# Patient Record
Sex: Male | Born: 1995 | Hispanic: No | Marital: Single | Smoking: Former smoker
Health system: Southern US, Community
[De-identification: ages and names within clinical notes are randomized; demographics above are authoritative.]

## PROBLEM LIST (undated history)

## (undated) DIAGNOSIS — G4700 Insomnia, unspecified: Secondary | ICD-10-CM

## (undated) DIAGNOSIS — R569 Unspecified convulsions: Secondary | ICD-10-CM

## (undated) DIAGNOSIS — F419 Anxiety disorder, unspecified: Secondary | ICD-10-CM

## (undated) HISTORY — PX: LABRAL REPAIR: SHX5172

---

## 2017-06-10 ENCOUNTER — Emergency Department (HOSPITAL_COMMUNITY): Payer: Non-veteran care

## 2017-06-10 ENCOUNTER — Encounter (HOSPITAL_COMMUNITY): Payer: Self-pay

## 2017-06-10 DIAGNOSIS — F419 Anxiety disorder, unspecified: Secondary | ICD-10-CM | POA: Insufficient documentation

## 2017-06-10 DIAGNOSIS — J209 Acute bronchitis, unspecified: Secondary | ICD-10-CM | POA: Insufficient documentation

## 2017-06-10 DIAGNOSIS — Z79899 Other long term (current) drug therapy: Secondary | ICD-10-CM | POA: Insufficient documentation

## 2017-06-10 DIAGNOSIS — Z87891 Personal history of nicotine dependence: Secondary | ICD-10-CM | POA: Insufficient documentation

## 2017-06-10 DIAGNOSIS — R05 Cough: Secondary | ICD-10-CM | POA: Diagnosis present

## 2017-06-10 NOTE — ED Triage Notes (Signed)
Pt complains of being short of breath for about an hour He states he has anxiety but it usually goes away He also says that his right arm is becoming numb at his wrist

## 2017-06-11 ENCOUNTER — Other Ambulatory Visit: Payer: Self-pay

## 2017-06-11 ENCOUNTER — Emergency Department (HOSPITAL_COMMUNITY)
Admission: EM | Admit: 2017-06-11 | Discharge: 2017-06-11 | Disposition: A | Discharge status: Home / Self Care | Payer: Non-veteran care | Attending: Emergency Medicine | Admitting: Emergency Medicine

## 2017-06-11 DIAGNOSIS — J40 Bronchitis, not specified as acute or chronic: Secondary | ICD-10-CM

## 2017-06-11 DIAGNOSIS — F419 Anxiety disorder, unspecified: Secondary | ICD-10-CM

## 2017-06-11 MED ORDER — AMOXICILLIN 500 MG PO CAPS: 500.0000 mg | ORAL_CAPSULE | Freq: Three times a day (TID) | ORAL | 0 refills | Status: DC

## 2017-06-11 MED ORDER — LORAZEPAM 1 MG PO TABS
1.0000 mg | ORAL_TABLET | Freq: Once | ORAL | Status: AC
  Administered 2017-06-11: 1 mg via ORAL
  Filled 2017-06-11: qty 1

## 2017-06-11 NOTE — ED Provider Notes (Signed)
[facility]-EMERGENCY DEPT Provider Note   CSN: [id number] Arrival date & time: 06/10/17  2135  Time seen 04:10 AM   History   Chief Complaint Chief Complaint  Patient presents with  . Shortness of Breath    HPI Dylan Castillo is a 21 y.o. male.  HPI patient states he has had a cough with green sputum production for a couple weeks.  He denies any fever.  He has been taking Mucinex over-the-counter.  He states about 10 PM yesterday evening his chest started feeling tight and he felt dizzy and his right hand got numb.  He denies any wheezing.  He states he was watching TV at the time.  He states he has had this before with anxiety.  He states he is having a lot of panic attacks.  He was seen by a doctor about 6 months ago and was given hydroxyzine which did not work.  This was when he was living in [place].  He states he is never seen a psychiatrist or had an evaluation for anxiety.  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  He denies having PTSD.  He denies depression, suicidal ideation or homicidal ideation.  PCP none  History reviewed. No pertinent past medical history.  There are no active problems to display for this patient.   History reviewed. No pertinent surgical history.     Home Medications    Prior to Admission medications   Medication Sig Start Date End Date Taking? Authorizing Provider  dextromethorphan-guaiFENesin (MUCINEX DM) 30-600 MG 12hr tablet Take 1 tablet by mouth 2 (two) times daily as needed for cough (congestion).   Yes [provider]  guaifenesin (ROBITUSSIN) 100 MG/5ML syrup Take 200 mg by mouth 3 (three) times daily as needed for cough or congestion.   Yes [provider]  amoxicillin (AMOXIL) 500 MG capsule Take 1 capsule (500 mg total) by mouth 3 (three) times daily. 06/11/17   Devoria Albe, MD    Family History History reviewed. No  pertinent family history.  Social History Social History  Substance Use Topics  . Smoking status: Former Games developer  . Smokeless tobacco: Never Used  . Alcohol use No  ex [employer] On disability for labral tear Plans on going to school to study medicine Does drink but not recently Just moved to [facility]    Allergies   Patient has no known allergies.   Review of Systems Review of Systems  All other systems reviewed and are negative.    Physical Exam Updated Vital Signs BP 131/80 (BP Location: Right Arm)   Pulse 96   Temp 97.7 F (36.5 C) (Oral)   Resp (!) 21   SpO2 100%   Physical Exam  Constitutional: He is oriented to person, place, and time. He appears well-developed and well-nourished.  Non-toxic appearance. He does not appear ill. No distress.  HENT:  Head: Normocephalic and atraumatic.  Right Ear: External ear normal.  Left Ear: External ear normal.  Nose: Nose normal. No mucosal edema or rhinorrhea.  Mouth/Throat: Oropharynx is clear and moist and mucous membranes are normal. No dental abscesses or uvula swelling.  Eyes: Pupils are equal, round, and reactive to light. Conjunctivae and EOM are normal.  Neck: Normal range of motion and full passive range of motion without pain. Neck supple.  Cardiovascular: Normal rate, regular rhythm and normal heart sounds.  Exam reveals no  gallop and no friction rub.   No murmur heard. Pulmonary/Chest: Effort normal and breath sounds normal. No respiratory distress. He has no wheezes. He has no rhonchi. He has no rales. He exhibits no tenderness and no crepitus.  Abdominal: Soft. Normal appearance and bowel sounds are normal. He exhibits no distension. There is no tenderness. There is no rebound and no guarding.  Musculoskeletal: Normal range of motion. He exhibits no edema or tenderness.  Moves all extremities well.   Neurological: He is alert and oriented to person, place, and time. He has normal strength. No cranial nerve deficit.   Skin: Skin is warm, dry and intact. No rash noted. No erythema. No pallor.  Psychiatric: His speech is normal and behavior is normal. His mood appears anxious.  Taping his leg while talking to me  Nursing note and vitals reviewed.    ED Treatments / Results  Labs (all labs ordered are listed, but only abnormal results are displayed) Labs Reviewed - No data to display  EKG  EKG Interpretation  Date/Time:  Thursday June 11 2017 03:17:34 EDT Ventricular Rate:  87 PR Interval:    QRS Duration: 96 QT Interval:  377 QTC Calculation: 454 R Axis:   93 Text Interpretation:  Sinus rhythm Borderline right axis deviation Otherwise within normal limits No old tracing to compare Confirmed by Devoria AlbeKnapp, Shiane Wenberg ([id number]) on 06/11/2017 4:39:51 AM       Radiology Dg Chest 2 View  Result Date: 06/10/2017 CLINICAL DATA:  Dyspnea and chest tightness.  Ex-smoker. EXAM: CHEST  2 VIEW COMPARISON:  None. FINDINGS: The heart size and mediastinal contours are within normal limits. Both lungs are clear. The visualized skeletal structures are unremarkable. IMPRESSION: No active cardiopulmonary disease. Electronically Signed   By: Tollie Ethavid  Kwon M.D.   On: 06/10/2017 22:58    Procedures Procedures (including critical care time)  Medications Ordered in ED Medications  LORazepam (ATIVAN) tablet 1 mg (not administered)     Initial Impression / Assessment and Plan / ED Course  I have reviewed the triage vital signs and the nursing notes.  Pertinent labs & imaging results that were available during my care of the patient were reviewed by me and considered in my medical decision making (see chart for details).    Patient was given one Ativan in the ED.  He was started on amoxicillin for his bronchitis.  He was given the resource guide to try to get a local psychiatrist to evaluate and manage his anxiety or [facility].   Final Clinical Impressions(s) / ED Diagnoses    Final diagnoses:  Anxiety  Bronchitis    New Prescriptions New Prescriptions   AMOXICILLIN (AMOXIL) 500 MG CAPSULE    Take 1 capsule (500 mg total) by mouth 3 (three) times daily.    Plan discharge  Devoria AlbeIva Harshan Kearley, MD, Concha PyoFACEP    Duong Haydel, MD 06/11/17 [phone]

## 2017-06-11 NOTE — Discharge Instructions (Signed)
Take the antibiotic until gone. Continue the mucinex as needed for your cough. Recheck if you get a high fever or seem worse. You need to get an evaluation by a mental health worker for your anxiety, they will need to decide what medication you should be on and follow you to make sure you are doing better. You can do [facility] a [facility] or a local psychiatrist. Return to the ED if you feel like you are going to harm yourself or someone else.

## 2017-06-11 NOTE — ED Notes (Signed)
Bed: [facility]PT2 Expected date:  Expected time:  Means of arrival:  Comments: 

## 2018-03-10 ENCOUNTER — Encounter (HOSPITAL_COMMUNITY): Payer: Self-pay | Admitting: Emergency Medicine

## 2018-03-10 ENCOUNTER — Emergency Department (HOSPITAL_COMMUNITY)
Admission: EM | Admit: 2018-03-10 | Discharge: 2018-03-11 | Disposition: A | Discharge status: Home / Self Care | Payer: Non-veteran care | Attending: Emergency Medicine | Admitting: Emergency Medicine

## 2018-03-10 ENCOUNTER — Emergency Department (HOSPITAL_COMMUNITY): Payer: Non-veteran care

## 2018-03-10 DIAGNOSIS — R569 Unspecified convulsions: Secondary | ICD-10-CM | POA: Diagnosis not present

## 2018-03-10 DIAGNOSIS — Z87891 Personal history of nicotine dependence: Secondary | ICD-10-CM | POA: Insufficient documentation

## 2018-03-10 DIAGNOSIS — M25511 Pain in right shoulder: Secondary | ICD-10-CM | POA: Insufficient documentation

## 2018-03-10 LAB — CBG MONITORING, ED: GLUCOSE-CAPILLARY: 102 mg/dL — AB (ref 70–99)

## 2018-03-10 MED ORDER — LEVETIRACETAM 500 MG PO TABS: 500.0000 mg | ORAL_TABLET | Freq: Two times a day (BID) | ORAL | 1 refills | Status: DC

## 2018-03-10 MED ORDER — OXYCODONE-ACETAMINOPHEN 5-325 MG PO TABS
2.0000 | ORAL_TABLET | Freq: Once | ORAL | Status: AC
  Administered 2018-03-10: 2 via ORAL
  Filled 2018-03-10: qty 2

## 2018-03-10 MED ORDER — IBUPROFEN 800 MG PO TABS: 800.0000 mg | ORAL_TABLET | Freq: Three times a day (TID) | ORAL | 0 refills | Status: AC

## 2018-03-10 MED ORDER — LEVETIRACETAM 500 MG PO TABS
1000.0000 mg | ORAL_TABLET | Freq: Once | ORAL | Status: AC
  Administered 2018-03-10: 1000 mg via ORAL
  Filled 2018-03-10: qty 2

## 2018-03-10 MED ORDER — KETOROLAC TROMETHAMINE 60 MG/2ML IM SOLN
60.0000 mg | Freq: Once | INTRAMUSCULAR | Status: AC
  Administered 2018-03-10: 60 mg via INTRAMUSCULAR
  Filled 2018-03-10: qty 2

## 2018-03-10 NOTE — ED Triage Notes (Signed)
  Patient was having an argument with significant other.  Started having anxiety attack and felt dizzy.  Girlfriend stated he had a seizure that lasted 2 mins and he was stiff and shaking.  Patient fell from standing position on R arm.  Patient was post ictal on EMS arrival.  Patient states no ETOH or drugs.  Patient is A&O x 4.  Pain 6/10

## 2018-03-11 ENCOUNTER — Emergency Department (HOSPITAL_BASED_OUTPATIENT_CLINIC_OR_DEPARTMENT_OTHER): Payer: No Typology Code available for payment source

## 2018-03-11 ENCOUNTER — Other Ambulatory Visit: Payer: Self-pay

## 2018-03-11 ENCOUNTER — Encounter (HOSPITAL_BASED_OUTPATIENT_CLINIC_OR_DEPARTMENT_OTHER): Payer: Self-pay | Admitting: Emergency Medicine

## 2018-03-11 ENCOUNTER — Emergency Department (HOSPITAL_BASED_OUTPATIENT_CLINIC_OR_DEPARTMENT_OTHER)
Admission: EM | Admit: 2018-03-11 | Discharge: 2018-03-11 | Disposition: A | Discharge status: Home / Self Care | Payer: No Typology Code available for payment source | Attending: Emergency Medicine | Admitting: Emergency Medicine

## 2018-03-11 DIAGNOSIS — Y999 Unspecified external cause status: Secondary | ICD-10-CM | POA: Diagnosis not present

## 2018-03-11 DIAGNOSIS — S4991XA Unspecified injury of right shoulder and upper arm, initial encounter: Secondary | ICD-10-CM | POA: Diagnosis present

## 2018-03-11 DIAGNOSIS — X58XXXA Exposure to other specified factors, initial encounter: Secondary | ICD-10-CM | POA: Diagnosis not present

## 2018-03-11 DIAGNOSIS — S43014A Anterior dislocation of right humerus, initial encounter: Secondary | ICD-10-CM | POA: Insufficient documentation

## 2018-03-11 DIAGNOSIS — S43004A Unspecified dislocation of right shoulder joint, initial encounter: Secondary | ICD-10-CM

## 2018-03-11 DIAGNOSIS — Y939 Activity, unspecified: Secondary | ICD-10-CM | POA: Diagnosis not present

## 2018-03-11 DIAGNOSIS — Y929 Unspecified place or not applicable: Secondary | ICD-10-CM | POA: Diagnosis not present

## 2018-03-11 HISTORY — DX: Anxiety disorder, unspecified: F41.9

## 2018-03-11 HISTORY — DX: Insomnia, unspecified: G47.00

## 2018-03-11 HISTORY — DX: Unspecified convulsions: R56.9

## 2018-03-11 MED ORDER — PROPOFOL BOLUS VIA INFUSION
1.0000 mg/kg | Freq: Once | INTRAVENOUS | Status: DC
  Filled 2018-03-11: qty 74

## 2018-03-11 MED ORDER — PROPOFOL 10 MG/ML IV BOLUS
INTRAVENOUS | Status: AC | PRN
  Administered 2018-03-11: 35 mg via INTRAVENOUS
  Administered 2018-03-11 (×2): 70 mg via INTRAVENOUS
  Administered 2018-03-11: 35 mg via INTRAVENOUS

## 2018-03-11 MED ORDER — PROPOFOL 10 MG/ML IV BOLUS
INTRAVENOUS | Status: AC
  Filled 2018-03-11: qty 20

## 2018-03-11 MED ORDER — FENTANYL CITRATE (PF) 100 MCG/2ML IJ SOLN
200.0000 ug | Freq: Once | INTRAMUSCULAR | Status: AC
  Administered 2018-03-11: 200 ug via INTRAVENOUS
  Filled 2018-03-11: qty 4

## 2018-03-11 MED ORDER — HYDROCODONE-ACETAMINOPHEN 5-325 MG PO TABS: 1.0000 | ORAL_TABLET | ORAL | 0 refills | Status: AC | PRN

## 2018-03-11 NOTE — ED Notes (Signed)
Pt A&O x 4, walking with steady gait upon d/c. Girlfriend to drive pt home.

## 2018-03-11 NOTE — ED Notes (Signed)
Dr. Madilyn Hookees at bedside. Pt given po fluids.

## 2018-03-11 NOTE — ED Provider Notes (Signed)
[facility] EMERGENCY DEPARTMENT Provider Note   CSN: [id number] Arrival date & time: 03/11/18  0543     History   Chief Complaint Chief Complaint  Patient presents with  . Shoulder Pain    HPI Dylan Castillo is a 22 y.o. male.  Patient presents to the emergency department with complaints of severe right shoulder pain.  Patient thinks that he dislocated his shoulder.  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  Patient reports that he rates his arm up this morning and felt a pop, has severe pain and decreased mobility of the shoulder ever since.     Past Medical History:  Diagnosis Date  . Anxiety   . Insomnia   . Seizures (HCC)     There are no active problems to display for this patient.   Past Surgical History:  Procedure Laterality Date  . LABRAL REPAIR          Home Medications    Prior to Admission medications   Medication Sig Start Date End Date Taking? Authorizing Provider  HYDROcodone-acetaminophen (NORCO/VICODIN) 5-325 MG tablet Take 1-2 tablets by mouth every 4 (four) hours as needed for moderate pain. 03/11/18   Gilda CreasePollina, Christopher J, MD  ibuprofen (ADVIL,MOTRIN) 800 MG tablet Take 1 tablet (800 mg total) by mouth 3 (three) times daily. 03/10/18   Mesner, Barbara CowerJason, MD  levETIRAcetam (KEPPRA) 500 MG tablet Take 1 tablet (500 mg total) by mouth 2 (two) times daily. 03/10/18   Mesner, Barbara CowerJason, MD    Family History No family history on file.  Social History Social History   Tobacco Use  . Smoking status: Former Games developermoker  . Smokeless tobacco: Never Used  Substance Use Topics  . Alcohol use: No  . Drug use: No     Allergies   Patient has no known allergies.   Review of Systems Review of Systems  Musculoskeletal: Positive for arthralgias.  All other systems reviewed and are negative.    Physical Exam Updated Vital Signs BP 112/69 (BP Location: Left Arm)   Pulse 67   Temp 97.8 F (36.6 C) (Oral)   Resp 18   Ht 5\' 9"   (1.753 m)   Wt 73.9 kg (163 lb)   SpO2 100%   BMI 24.07 kg/m   Physical Exam  Constitutional: He is oriented to person, place, and time. He appears well-developed and well-nourished. No distress.  HENT:  Head: Normocephalic and atraumatic.  Right Ear: Hearing normal.  Left Ear: Hearing normal.  Nose: Nose normal.  Mouth/Throat: Oropharynx is clear and moist and mucous membranes are normal.  Eyes: Pupils are equal, round, and reactive to light. Conjunctivae and EOM are normal.  Neck: Normal range of motion. Neck supple.  Cardiovascular: Regular rhythm, S1 normal and S2 normal. Exam reveals no gallop and no friction rub.  No murmur heard. Pulmonary/Chest: Effort normal and breath sounds normal. No respiratory distress. He exhibits no tenderness.  Abdominal: Soft. Normal appearance and bowel sounds are normal. There is no hepatosplenomegaly. There is no tenderness. There is no rebound, no guarding, no tenderness at McBurney's point and negative Murphy's sign. No hernia.  Musculoskeletal:       Right shoulder: He exhibits decreased range of motion and tenderness.  Neurological: He is alert and oriented to person, place, and time. He has normal strength. No cranial nerve deficit or sensory deficit. Coordination normal. GCS eye subscore is 4. GCS verbal subscore is 5. GCS motor subscore is 6.  Skin: Skin is  warm, dry and intact. No rash noted. No cyanosis.  Psychiatric: He has a normal mood and affect. His speech is normal and behavior is normal. Thought content normal.  Nursing note and vitals reviewed.    ED Treatments / Results  Labs (all labs ordered are listed, but only abnormal results are displayed) Labs Reviewed - No data to display  EKG None  Radiology Dg Shoulder Right  Result Date: 03/10/2018 CLINICAL DATA:  Right shoulder pain after seizure EXAM: RIGHT SHOULDER - 2+ VIEW COMPARISON:  None. FINDINGS: There is no evidence of fracture or dislocation. There is no evidence of  arthropathy or other focal bone abnormality. Soft tissues are unremarkable. IMPRESSION: Negative. Electronically Signed   By: Jasmine Pang M.D.   On: 03/10/2018 22:53    Procedures .Sedation Date/Time: 03/11/2018 7:05 AM Performed by: Gilda Crease, MD Authorized by: Gilda Crease, MD   Consent:    Consent obtained:  Verbal   Consent given by:  Patient   Risks discussed:  Allergic reaction, dysrhythmia, inadequate sedation, nausea, prolonged hypoxia resulting in organ damage, prolonged sedation necessitating reversal, respiratory compromise necessitating ventilatory assistance and intubation and vomiting   Alternatives discussed:  Analgesia without sedation, anxiolysis and regional anesthesia Universal protocol:    Procedure explained and questions answered to patient or proxy's satisfaction: yes     Relevant documents present and verified: yes     Test results available and properly labeled: yes     Imaging studies available: yes     Required blood products, implants, devices, and special equipment available: yes     Site/side marked: yes     Immediately prior to procedure a time out was called: yes     Patient identity confirmation method:  Verbally with patient Indications:    Procedure necessitating sedation performed by:  Physician performing sedation   Intended level of sedation:  Deep Pre-sedation assessment:    Time since last food or drink:  >8hours   ASA classification: class 1 - normal, healthy patient     Neck mobility: normal     Mouth opening:  3 or more finger widths   Thyromental distance:  4 finger widths   Mallampati score:  I - soft palate, uvula, fauces, pillars visible   Pre-sedation assessments completed and reviewed: airway patency, cardiovascular function, hydration status, mental status, nausea/vomiting, pain level, respiratory function and temperature   Immediate pre-procedure details:    Reassessment: Patient reassessed immediately prior  to procedure     Reviewed: vital signs, relevant labs/tests and NPO status     Verified: bag valve mask available, emergency equipment available, intubation equipment available, IV patency confirmed, oxygen available and suction available   Procedure details (see MAR for exact dosages):    Preoxygenation:  Nasal cannula   Sedation:  Propofol   Intra-procedure monitoring:  Blood pressure monitoring, cardiac monitor, continuous pulse oximetry, frequent LOC assessments, frequent vital sign checks and continuous capnometry   Intra-procedure events: none     Total Provider sedation time (minutes):  20 Post-procedure details:    Attendance: Constant attendance by certified staff until patient recovered     Recovery: Patient returned to pre-procedure baseline     Post-sedation assessments completed and reviewed: airway patency, cardiovascular function, hydration status, mental status, nausea/vomiting, pain level, respiratory function and temperature     Patient is stable for discharge or admission: yes     Patient tolerance:  Tolerated well, no immediate complications .Ortho Injury Treatment Date/Time: 03/11/2018 7:07 AM  Performed by: Gilda Crease, MD Authorized by: Gilda Crease, MD   Consent:    Consent obtained:  Written   Consent given by:  Patient   Risks discussed:  Fracture, nerve damage, irreducible dislocation and recurrent dislocation Universal protocol:    Procedure explained and questions answered to patient or proxy's satisfaction: yes     Site/side marked: yes     Immediately prior to procedure a time out was called: yes     Patient identity confirmed:  Verbally with patient and arm bandInjury location: shoulder Location details: right shoulder Injury type: dislocation Dislocation type: anterior Chronicity: new Pre-procedure neurovascular assessment: neurovascularly intact Pre-procedure distal perfusion: normal Pre-procedure neurological function:  normal Pre-procedure range of motion: reduced  Anesthesia: Local anesthesia used: no  Patient sedated: Yes. Refer to sedation procedure documentation for details of sedation. Manipulation performed: yes Reduction method: Milch technique and traction and counter traction Reduction successful: yes X-ray confirmed reduction: yes Immobilization: sling Post-procedure neurovascular assessment: post-procedure neurovascularly intact Post-procedure distal perfusion: normal Post-procedure neurological function: normal Post-procedure range of motion: normal Patient tolerance: Patient tolerated the procedure well with no immediate complications    (including critical care time)  Medications Ordered in ED Medications  propofol (DIPRIVAN) bolus via infusion 73.9 mg (has no administration in time range)  propofol (DIPRIVAN) 10 mg/mL bolus/IV push (has no administration in time range)  propofol (DIPRIVAN) 10 mg/mL bolus/IV push (70 mg Intravenous Given 03/11/18 0701)  fentaNYL (SUBLIMAZE) injection 200 mcg (200 mcg Intravenous Given 03/11/18 1610)     Initial Impression / Assessment and Plan / ED Course  I have reviewed the triage vital signs and the nursing notes.  Pertinent labs & imaging results that were available during my care of the patient were reviewed by me and considered in my medical decision making (see chart for details).       Final Clinical Impressions(s) / ED Diagnoses   Final diagnoses:  Shoulder dislocation, right, initial encounter    ED Discharge Orders        Ordered    HYDROcodone-acetaminophen (NORCO/VICODIN) 5-325 MG tablet  Every 4 hours PRN     03/11/18 0712       Gilda Crease, MD 03/11/18 [phone]

## 2018-03-11 NOTE — ED Triage Notes (Addendum)
Pt states he thinks dislocated his shoulder this morning. [facility] Pt had negative xray and was given Rx for ibuprofen and keppra. Pt states he dislocated his right shoulder 20 min PTA after lifting his arm.

## 2018-03-11 NOTE — ED Provider Notes (Signed)
[facility] Provider Note   CSN: [id number] Arrival date & time: 03/10/18  2150     History   Chief Complaint Chief Complaint  Patient presents with  . Seizures  . Shoulder Pain    HPI Dylan Castillo is a 22 y.o. male.   Seizures   This is a recurrent problem. The current episode started 1 to 2 hours ago. The problem has been resolved. There was 1 seizure. The most recent episode lasted 30 to 120 seconds. Characteristics do not include eye blinking. The episode was witnessed. There was no sensation of an aura present. The seizure(s) had no focality. There has been no fever.  Shoulder Pain      Past Medical History:  Diagnosis Date  . Anxiety   . Insomnia   . Seizures (HCC)     There are no active problems to display for this patient.   Past Surgical History:  Procedure Laterality Date  . LABRAL REPAIR          Home Medications    Prior to Admission medications   Medication Sig Start Date End Date Taking? Authorizing Provider  HYDROcodone-acetaminophen (NORCO/VICODIN) 5-325 MG tablet Take 1-2 tablets by mouth every 4 (four) hours as needed for moderate pain. 03/11/18   Gilda CreasePollina, Christopher J, MD  ibuprofen (ADVIL,MOTRIN) 800 MG tablet Take 1 tablet (800 mg total) by mouth 3 (three) times daily. 03/10/18   Ares Cardozo, Barbara CowerJason, MD  levETIRAcetam (KEPPRA) 500 MG tablet Take 1 tablet (500 mg total) by mouth 2 (two) times daily. 03/10/18   Marciano Mundt, Barbara CowerJason, MD    Family History History reviewed. No pertinent family history.  Social History Social History   Tobacco Use  . Smoking status: Former Games developermoker  . Smokeless tobacco: Never Used  Substance Use Topics  . Alcohol use: No  . Drug use: No     Allergies   Patient has no known allergies.   Review of Systems Review of Systems  Neurological: Positive for seizures.  All other systems reviewed and are negative.    Physical Exam Updated Vital Signs BP 108/77   Pulse 88    Temp 97.9 F (36.6 C) (Oral)   Resp 16   SpO2 100%   Physical Exam  Constitutional: He appears well-developed and well-nourished.  HENT:  Head: Normocephalic and atraumatic.  Eyes: Conjunctivae and EOM are normal.  Neck: Normal range of motion.  Cardiovascular: Normal rate.  Pulmonary/Chest: Effort normal. No respiratory distress.  Abdominal: Soft. Bowel sounds are normal. He exhibits no distension.  Musculoskeletal: Normal range of motion. He exhibits tenderness (over right shoulder).  Neurological: He is alert.  Skin: Skin is warm and dry.  Nursing note and vitals reviewed.    ED Treatments / Results  Labs (all labs ordered are listed, but only abnormal results are displayed) Labs Reviewed  CBG MONITORING, ED - Abnormal; Notable for the following components:      Result Value   Glucose-Capillary 102 (*)    All other components within normal limits    EKG EKG Interpretation  Date/Time:  Wednesday March 10 2018 21:59:16 EDT Ventricular Rate:  79 PR Interval:    QRS Duration: 104 QT Interval:  369 QTC Calculation: 423 R Axis:   105 Text Interpretation:  Right and left arm electrode reversal, interpretation assumes no reversal Sinus or ectopic atrial rhythm Borderline right axis deviation Abnormal T, consider ischemia, lateral leads Since last tracing, electrode reversal Confirmed by Shaune PollackIsaacs, Cameron [id number]) on 03/11/2018  3:50:33 PM   Radiology Dg Shoulder Right  Result Date: 03/10/2018 CLINICAL DATA:  Right shoulder pain after seizure EXAM: RIGHT SHOULDER - 2+ VIEW COMPARISON:  None. FINDINGS: There is no evidence of fracture or dislocation. There is no evidence of arthropathy or other focal bone abnormality. Soft tissues are unremarkable. IMPRESSION: Negative. Electronically Signed   By: Jasmine Pang M.D.   On: 03/10/2018 22:53   Procedures Procedures (including critical care time)  Medications Ordered in ED Medications  levETIRAcetam (KEPPRA) tablet 1,000 mg (1,000  mg Oral Given 03/10/18 2344)  oxyCODONE-acetaminophen (PERCOCET/ROXICET) 5-325 MG per tablet 2 tablet (2 tablets Oral Given 03/10/18 2344)  ketorolac (TORADOL) injection 60 mg (60 mg Intramuscular Given 03/10/18 2345)     Initial Impression / Assessment and Plan / ED Course  I have reviewed the triage vital signs and the nursing notes.  Pertinent labs & imaging results that were available during my care of the patient were reviewed by me and considered in my medical decision making (see chart for details).     Recurrent seizures and shoulder pain.  X-ray negative for injury to his shoulder. Exam otherwise unremarkable.  No indication for work-up with multiple work-ups in the past for the same.  Will start Keppra[facility] and [facility] for discomfort.  Final Clinical Impressions(s) / ED Diagnoses   Final diagnoses:  Seizure (HCC)  Acute pain of right shoulder    ED Discharge Orders        Ordered    levETIRAcetam (KEPPRA) 500 MG tablet  2 times daily     03/10/18 2329    ibuprofen (ADVIL,MOTRIN) 800 MG tablet  3 times daily     03/10/18 2329       Chelsey Kimberley, Barbara Cower, MD 03/11/18 2326

## 2018-05-12 ENCOUNTER — Other Ambulatory Visit: Payer: Self-pay

## 2018-05-12 ENCOUNTER — Emergency Department (HOSPITAL_COMMUNITY)
Admission: EM | Admit: 2018-05-12 | Discharge: 2018-05-12 | Disposition: A | Discharge status: Home / Self Care | Payer: Non-veteran care | Attending: Emergency Medicine | Admitting: Emergency Medicine

## 2018-05-12 ENCOUNTER — Encounter (HOSPITAL_COMMUNITY): Payer: Self-pay

## 2018-05-12 DIAGNOSIS — R51 Headache: Secondary | ICD-10-CM | POA: Insufficient documentation

## 2018-05-12 DIAGNOSIS — Z87891 Personal history of nicotine dependence: Secondary | ICD-10-CM | POA: Diagnosis not present

## 2018-05-12 DIAGNOSIS — R569 Unspecified convulsions: Secondary | ICD-10-CM | POA: Insufficient documentation

## 2018-05-12 DIAGNOSIS — Z91128 Patient's intentional underdosing of medication regimen for other reason: Secondary | ICD-10-CM | POA: Insufficient documentation

## 2018-05-12 DIAGNOSIS — T426X6A Underdosing of other antiepileptic and sedative-hypnotic drugs, initial encounter: Secondary | ICD-10-CM | POA: Insufficient documentation

## 2018-05-12 DIAGNOSIS — Z91199 Patient's noncompliance with other medical treatment and regimen due to unspecified reason: Secondary | ICD-10-CM

## 2018-05-12 DIAGNOSIS — M25511 Pain in right shoulder: Secondary | ICD-10-CM | POA: Diagnosis not present

## 2018-05-12 DIAGNOSIS — Z9119 Patient's noncompliance with other medical treatment and regimen: Secondary | ICD-10-CM

## 2018-05-12 MED ORDER — LEVETIRACETAM IN NACL 1000 MG/100ML IV SOLN
1000.0000 mg | Freq: Once | INTRAVENOUS | Status: AC
  Administered 2018-05-12: 1000 mg via INTRAVENOUS
  Filled 2018-05-12: qty 100

## 2018-05-12 MED ORDER — LEVETIRACETAM 500 MG PO TABS: 500.0000 mg | ORAL_TABLET | Freq: Two times a day (BID) | ORAL | 0 refills | Status: AC

## 2018-05-12 MED ORDER — LORAZEPAM 2 MG/ML IJ SOLN
1.0000 mg | Freq: Once | INTRAMUSCULAR | Status: AC
  Administered 2018-05-12: 1 mg via INTRAVENOUS
  Filled 2018-05-12: qty 1

## 2018-05-12 NOTE — ED Triage Notes (Signed)
EMS reports from home, girlfriend witnessed seizure states approx 3 minutes, shaking and LOC. Hx of seizures. Pt states he stopped taking Keppra 2 weeks ago.Pt A&O on EMS arrival.  BP 114/62 HR 104 RR 16 Sp02 98 RA CBG 87  20ga Left hand

## 2018-05-12 NOTE — ED Provider Notes (Signed)
[facility]-EMERGENCY DEPT Provider Note   CSN: [id number] Arrival date & time: 05/12/18  1709     History   Chief Complaint Chief Complaint  Patient presents with  . Seizures    HPI Dylan Castillo is a 22 y.o. male with a past medical history of seizures presents today for evaluation after a seizure.  He reports he has not taken his Keppra in the past 2 weeks.  He says that he did not like how it made him feel so he slowly weaned himself off of it.  He has an appointment with the neurologist at the [facility] in 1 to 2 weeks.  His visitor witnessed the seizure, she reports that he got stiff and then had a total seizure lasting about 3 minutes and was postictal afterwards.  She states he did not fall or strike his head, rather laid down on the couch.  He reports pain in his right shoulder, has a history of recurrent dislocations, and says that he knows this is not dislocated.  He reports mild bitemporal headache, his visitor reports that he was clenching his teeth during the seizure.  HPI  Past Medical History:  Diagnosis Date  . Anxiety   . Insomnia   . Seizures (HCC)     There are no active problems to display for this patient.   Past Surgical History:  Procedure Laterality Date  . LABRAL REPAIR          Home Medications    Prior to Admission medications   Medication Sig Start Date End Date Taking? Authorizing Provider  HYDROcodone-acetaminophen (NORCO/VICODIN) 5-325 MG tablet Take 1-2 tablets by mouth every 4 (four) hours as needed for moderate pain. Patient not taking: Reported on 05/12/2018 03/11/18   Gilda Crease, MD  ibuprofen (ADVIL,MOTRIN) 800 MG tablet Take 1 tablet (800 mg total) by mouth 3 (three) times daily. Patient not taking: Reported on 05/12/2018 03/10/18   Mesner, Barbara Cower, MD  levETIRAcetam (KEPPRA) 500 MG tablet Take 1 tablet (500 mg total) by mouth 2 (two) times daily. 05/12/18   Cristina Gong, PA-C    Family  History History reviewed. No pertinent family history.  Social History Social History   Tobacco Use  . Smoking status: Former Games developer  . Smokeless tobacco: Never Used  Substance Use Topics  . Alcohol use: No  . Drug use: No     Allergies   Patient has no known allergies.   Review of Systems Review of Systems  Constitutional: Negative for chills and fever.  HENT: Negative for congestion.   Eyes: Negative for visual disturbance.  Respiratory: Negative for chest tightness and shortness of breath.   Cardiovascular: Negative for chest pain and palpitations.  Gastrointestinal: Negative for abdominal pain, diarrhea, nausea and vomiting.  Musculoskeletal: Negative for back pain, neck pain and neck stiffness.  Skin: Negative for rash.  Neurological: Positive for seizures and headaches.  All other systems reviewed and are negative.    Physical Exam Updated Vital Signs BP 108/76   Pulse 88   Temp 98.1 F (36.7 C) (Oral)   Resp 16   Ht 5\' 10"  (1.778 m)   Wt 74.8 kg   SpO2 96%   BMI 23.68 kg/m   Physical Exam  Constitutional: He is oriented to person, place, and time. He appears well-developed and well-nourished. No distress.  HENT:  Head: Normocephalic and atraumatic.  Eyes: Conjunctivae are normal. Right eye exhibits no discharge. Left eye exhibits no discharge. No scleral icterus.  Neck: Normal range of motion.  Cardiovascular: Normal rate and regular rhythm.  Pulmonary/Chest: Effort normal. No stridor. No respiratory distress.  Abdominal: He exhibits no distension.  Musculoskeletal: He exhibits no edema or deformity.  Right shoulder abduction is slightly limited secondary to pain.  No localized tenderness to palpation along clavicle, scapula, or AC joint.  Neurological: He is alert and oriented to person, place, and time. He exhibits normal muscle tone.  No facial droop, speech is normal, he interacts appropriately.  5/5 strength in bilateral upper and lower  extremities.  Skin: Skin is warm and dry. He is not diaphoretic.  Psychiatric: He has a normal mood and affect. His behavior is normal.  Nursing note and vitals reviewed.    ED Treatments / Results  Labs (all labs ordered are listed, but only abnormal results are displayed) Labs Reviewed - No data to display  EKG None  Radiology No results found.  Procedures Procedures (including critical care time)  Medications Ordered in ED Medications  levETIRAcetam (KEPPRA) IVPB 1000 mg/100 mL premix ( Intravenous Stopped 05/12/18 1946)  LORazepam (ATIVAN) injection 1 mg (1 mg Intravenous Given 05/12/18 1958)     Initial Impression / Assessment and Plan / ED Course  I have reviewed the triage vital signs and the nursing notes.  Pertinent labs & imaging results that were available during my care of the patient were reviewed by me and considered in my medical decision making (see chart for details).  Clinical Course as of May 13 2314  Wed May 12, 2018  [id number] 22 year old male with history of seizure disorder off of his medication for couple weeks here after a seizure.  I was actually just called to his room now for a second seizure.  He is now not actively seizing but he is postictal and agitated.  He had some IV Keppra ordered and the nurses are working on getting him started.  He would benefit from some IV benzodiazepine to decrease his agitation at home medicines kick in.   [MB]  1950 Patient seized before getting keppra.  He has now gotten his IV keppra.  Will give ativan also and monitor.    [EH]    Clinical Course User Index [EH] Cristina Gong, PA-C [MB] Terrilee Files, MD   Patient presents today for evaluation of a seizure.  He has a history of seizures and has not been taking his Keppra for 2 weeks.  He did not appear to have struck his head.  Mild bitemporal headache, patient states his not abnormal for him after a seizure.  He feels like he is back to his normal  baseline.  He was offered x-ray for his shoulder which he declined.  As he is non compliant with keppra will order IV loading dose, and give RX for 1 month supply to allow him to follow up with his neurologist.  Informed patient that he should not drive until cleared by neurology.   While in the emergency room, prior to getting his IV Keppra he had a second, witnessed, tonic-clonic type seizure.  He came out of the seizure on his own, afterwards he was given Ativan to prevent seizures while getting Keppra loading dose.    At the time of discharge patient was reevaluated, he reports that his head is feeling better, denies any complaints or concerns and is requesting discharge at this time.  Return precautions were discussed with patient who states their understanding.  Patient lives with his visitor who is  in the room, she reports that she will stay with him overnight and continue to monitor him as needed.  At the time of discharge patient denied any unaddressed complaints or concerns.  Patient is agreeable for discharge home.   Final Clinical Impressions(s) / ED Diagnoses   Final diagnoses:  Seizure [facility])    ED Discharge Orders         Ordered    levETIRAcetam (KEPPRA) 500 MG tablet  2 times daily     05/12/18 1902           Norman Clay 05/13/18 2319    Terrilee Files, MD 05/14/18 [phone]

## 2018-05-12 NOTE — ED Notes (Signed)
Patient is resting comfortably. 

## 2018-05-12 NOTE — ED Notes (Signed)
Bed: WA02 Expected date:  Expected time:  Means of arrival:  Comments: EMS seizure 

## 2018-05-12 NOTE — Discharge Instructions (Addendum)
Until you are cleared by neurology you may not drive, operate heavy machinery or perform other potentially dangerous tasks that could cause harm to you or someone else if you were to have a seizure.    Please do not swim, bathe in a tub, perform any activity where you may drown, or work at heights.    Please take your seizure medications.

## 2018-05-13 ENCOUNTER — Encounter (HOSPITAL_COMMUNITY): Payer: Self-pay

## 2018-05-13 ENCOUNTER — Emergency Department (HOSPITAL_COMMUNITY)
Admission: EM | Admit: 2018-05-13 | Discharge: 2018-05-13 | Disposition: A | Discharge status: Home / Self Care | Payer: Non-veteran care | Attending: Emergency Medicine | Admitting: Emergency Medicine

## 2018-05-13 ENCOUNTER — Emergency Department (HOSPITAL_COMMUNITY): Payer: Non-veteran care

## 2018-05-13 ENCOUNTER — Other Ambulatory Visit: Payer: Self-pay

## 2018-05-13 DIAGNOSIS — X58XXXA Exposure to other specified factors, initial encounter: Secondary | ICD-10-CM | POA: Insufficient documentation

## 2018-05-13 DIAGNOSIS — S46911D Strain of unspecified muscle, fascia and tendon at shoulder and upper arm level, right arm, subsequent encounter: Secondary | ICD-10-CM

## 2018-05-13 DIAGNOSIS — S42201A Unspecified fracture of upper end of right humerus, initial encounter for closed fracture: Secondary | ICD-10-CM | POA: Diagnosis not present

## 2018-05-13 DIAGNOSIS — Z79899 Other long term (current) drug therapy: Secondary | ICD-10-CM | POA: Insufficient documentation

## 2018-05-13 DIAGNOSIS — Y999 Unspecified external cause status: Secondary | ICD-10-CM | POA: Diagnosis not present

## 2018-05-13 DIAGNOSIS — S46911A Strain of unspecified muscle, fascia and tendon at shoulder and upper arm level, right arm, initial encounter: Secondary | ICD-10-CM | POA: Diagnosis not present

## 2018-05-13 DIAGNOSIS — Y929 Unspecified place or not applicable: Secondary | ICD-10-CM | POA: Insufficient documentation

## 2018-05-13 DIAGNOSIS — Y939 Activity, unspecified: Secondary | ICD-10-CM | POA: Insufficient documentation

## 2018-05-13 DIAGNOSIS — M21821 Other specified acquired deformities of right upper arm: Secondary | ICD-10-CM

## 2018-05-13 DIAGNOSIS — S4991XA Unspecified injury of right shoulder and upper arm, initial encounter: Secondary | ICD-10-CM | POA: Diagnosis present

## 2018-05-13 DIAGNOSIS — Z87891 Personal history of nicotine dependence: Secondary | ICD-10-CM | POA: Insufficient documentation

## 2018-05-13 MED ORDER — IBUPROFEN 800 MG PO TABS
800.0000 mg | ORAL_TABLET | Freq: Once | ORAL | Status: AC
  Administered 2018-05-13: 800 mg via ORAL
  Filled 2018-05-13: qty 1

## 2018-05-13 NOTE — ED Notes (Signed)
Patient transported to X-ray 

## 2018-05-13 NOTE — Discharge Instructions (Addendum)
Follow-up with primary care doctor in 2 days.  Keep shoulder immobilized for the next couple of days.  Take Motrin or Tylenol every 6 hours needed for pain.  Return to ED for any worsening or other concerns.

## 2018-05-13 NOTE — ED Provider Notes (Signed)
[facility] EMERGENCY DEPARTMENT Provider Note   CSN: [id number] Arrival date & time: 05/13/18  0849     History   Chief Complaint Chief Complaint  Patient presents with  . Shoulder Injury    HPI Dylan Castillo is a 22 y.o. male.  HPI  Patient is a 22yo male with PMHx of seizures and shoulder dislocations who presents with R shoulder pain x last night.  Shoulder pain is constant described as achy and worsens with movement.  No tx PTA.  No further seizures reported.  He denies all other complaints at this time.    Per chart reivew patient seen last night for seizure after noncompliance with Keppra x 2 weeks.  He was loaded with Keppra and discharged home. He was offered a shoulder XR at that time time however declined.  Past Medical History:  Diagnosis Date  . Anxiety   . Insomnia   . Seizures (HCC)     There are no active problems to display for this patient.   Past Surgical History:  Procedure Laterality Date  . LABRAL REPAIR          Home Medications    Prior to Admission medications   Medication Sig Start Date End Date Taking? Authorizing Provider  HYDROcodone-acetaminophen (NORCO/VICODIN) 5-325 MG tablet Take 1-2 tablets by mouth every 4 (four) hours as needed for moderate pain. Patient not taking: Reported on 05/12/2018 03/11/18   Gilda Crease, MD  ibuprofen (ADVIL,MOTRIN) 800 MG tablet Take 1 tablet (800 mg total) by mouth 3 (three) times daily. Patient not taking: Reported on 05/12/2018 03/10/18   Mesner, Barbara Cower, MD  levETIRAcetam (KEPPRA) 500 MG tablet Take 1 tablet (500 mg total) by mouth 2 (two) times daily. 05/12/18   Cristina Gong, PA-C    Family History History reviewed. No pertinent family history.  Social History Social History   Tobacco Use  . Smoking status: Former Games developer  . Smokeless tobacco: Never Used  Substance Use Topics  . Alcohol use: No  . Drug use: No     Allergies   Patient has no known  allergies.   Review of Systems Review of Systems  Constitutional: Negative for chills and fever.  HENT: Negative for rhinorrhea and sore throat.   Eyes: Negative for pain and visual disturbance.  Respiratory: Negative for cough and shortness of breath.   Cardiovascular: Negative for chest pain and palpitations.  Gastrointestinal: Negative for abdominal pain, diarrhea and vomiting.  Genitourinary: Negative for dysuria and hematuria.  Musculoskeletal:       +R shoulder pain  Skin: Negative for color change and rash.  Neurological: Negative for seizures and syncope.  All other systems reviewed and are negative.    Physical Exam Updated Vital Signs BP 133/84   Pulse 95   Temp 99.2 F (37.3 C)   Resp 16   SpO2 98%   Physical Exam  Constitutional: He is oriented to person, place, and time. He appears well-developed and well-nourished.  HENT:  Head: Normocephalic and atraumatic.  Mouth/Throat: Oropharynx is clear and moist.  Eyes: Pupils are equal, round, and reactive to light. Conjunctivae and EOM are normal.  Neck: Normal range of motion. Neck supple.  Cardiovascular: Normal rate, regular rhythm and intact distal pulses.  Pulmonary/Chest: Effort normal and breath sounds normal. No respiratory distress.  Abdominal: Soft. He exhibits no distension. There is no tenderness. There is no guarding.  Musculoskeletal: He exhibits no edema.  Right Shoulder INSPECTION: Normal appearance. No open  wounds. ALIGNMENT: Normal skeletal alignment without deformity. PALPATION: TTP over anterior aspect. No masses, crepitance or effusion.  ROM: Limited ROM 2/2 pain.  Able to range elbow and wrist. VASCULAR: Extremity warm and well-perfused. 2+  radial pulses. Capillary refill <2 seconds NEURO-SENSORY: Sensation is intact to light touch. NEURO-MOTOR: Motor function is intact. COMPARTMENTS: Soft. No pain with passive stretch.   Neurological: He is alert and oriented to person, place, and time.    Skin: Skin is warm and dry. Capillary refill takes less than 2 seconds.  Psychiatric: He has a normal mood and affect.  Nursing note and vitals reviewed.    ED Treatments / Results  Labs (all labs ordered are listed, but only abnormal results are displayed) Labs Reviewed - No data to display  EKG None  Radiology Dg Shoulder Right  Result Date: 05/13/2018 CLINICAL DATA:  Seizure, arm pain EXAM: RIGHT SHOULDER - 2+ VIEW COMPARISON:  03/11/2018 FINDINGS: Normal alignment no fracture. Chronic heel Sachs deformity unchanged from the prior study. IMPRESSION: No acute abnormality. Electronically Signed   By: Marlan Palau M.D.   On: 05/13/2018 10:10   Dg Humerus Right  Result Date: 05/13/2018 CLINICAL DATA:  Seizure, fall.  Arm pain EXAM: RIGHT HUMERUS - 2+ VIEW COMPARISON:  None. FINDINGS: There is no evidence of fracture or other focal bone lesions. Soft tissues are unremarkable. IMPRESSION: Negative. Electronically Signed   By: Marlan Palau M.D.   On: 05/13/2018 10:09    Procedures Procedures (including critical care time)  Medications Ordered in ED Medications  ibuprofen (ADVIL,MOTRIN) tablet 800 mg (800 mg Oral Given 05/13/18 1018)     Initial Impression / Assessment and Plan / ED Course  I have reviewed the triage vital signs and the nursing notes.  Pertinent labs & imaging results that were available during my care of the patient were reviewed by me and considered in my medical decision making (see chart for details).      Patient is a 22yo male with PMHx of seizures and shoulder dislocations who presents with R shoulder x last night after having two seizures.  Seen here for the same, given keppra load, and new keppra Rx.  He declined xray at that time. No further seizures reported.  On arrival he is HDS and well appearing..  Exam as above. NVI throughout.  Does not appear dislocated however will obtain XR given hx.    XR of right shoulder and right humerus without  acute fracture or malalignment.  No obvious dislocation.  Chronic Hill-Sachs deformity noted.  Patient given sling and Motrin.  Reviewed Markanthony Gedney instructions.  Stable for DC home.  Old records reviewed.  Imaging and labs reviewed and interpreted by myself and attending and used in the MDM.  Addressed patient question and concerns.  Reviewed discharged instructions with strict precautions given.  Advised patient to schedule follow-up with primary care provider.  Patient verbalized understanding and agrees with plan.  Patient stable at discharge.  The plan for this patient was discussed with Dr. Fredderick Phenix who voiced agreement and who oversaw evaluation and treatment of this patient.  Final Clinical Impressions(s) / ED Diagnoses   Final diagnoses:  Strain of right shoulder, subsequent encounter  Hill Sachs deformity, right    ED Discharge Orders    None       Abelardo Diesel, MD 05/14/18 1037    Rolan Bucco, MD 05/14/18 1042

## 2018-05-13 NOTE — ED Triage Notes (Signed)
Pt states he had 2 seizures last night and fell injuring his right shoulder. Pt reports hx of seizure. Also reports hx of shoulder dislocation.

## 2019-10-29 IMAGING — DX DG SHOULDER 2+V*R*
2 series · 2 of 2 positions shown · non-contrast
Comparison: None.

CLINICAL DATA: Right shoulder pain after seizure

EXAM:
RIGHT SHOULDER - 2+ VIEW

[x shoulder ap right (1 of 2)]
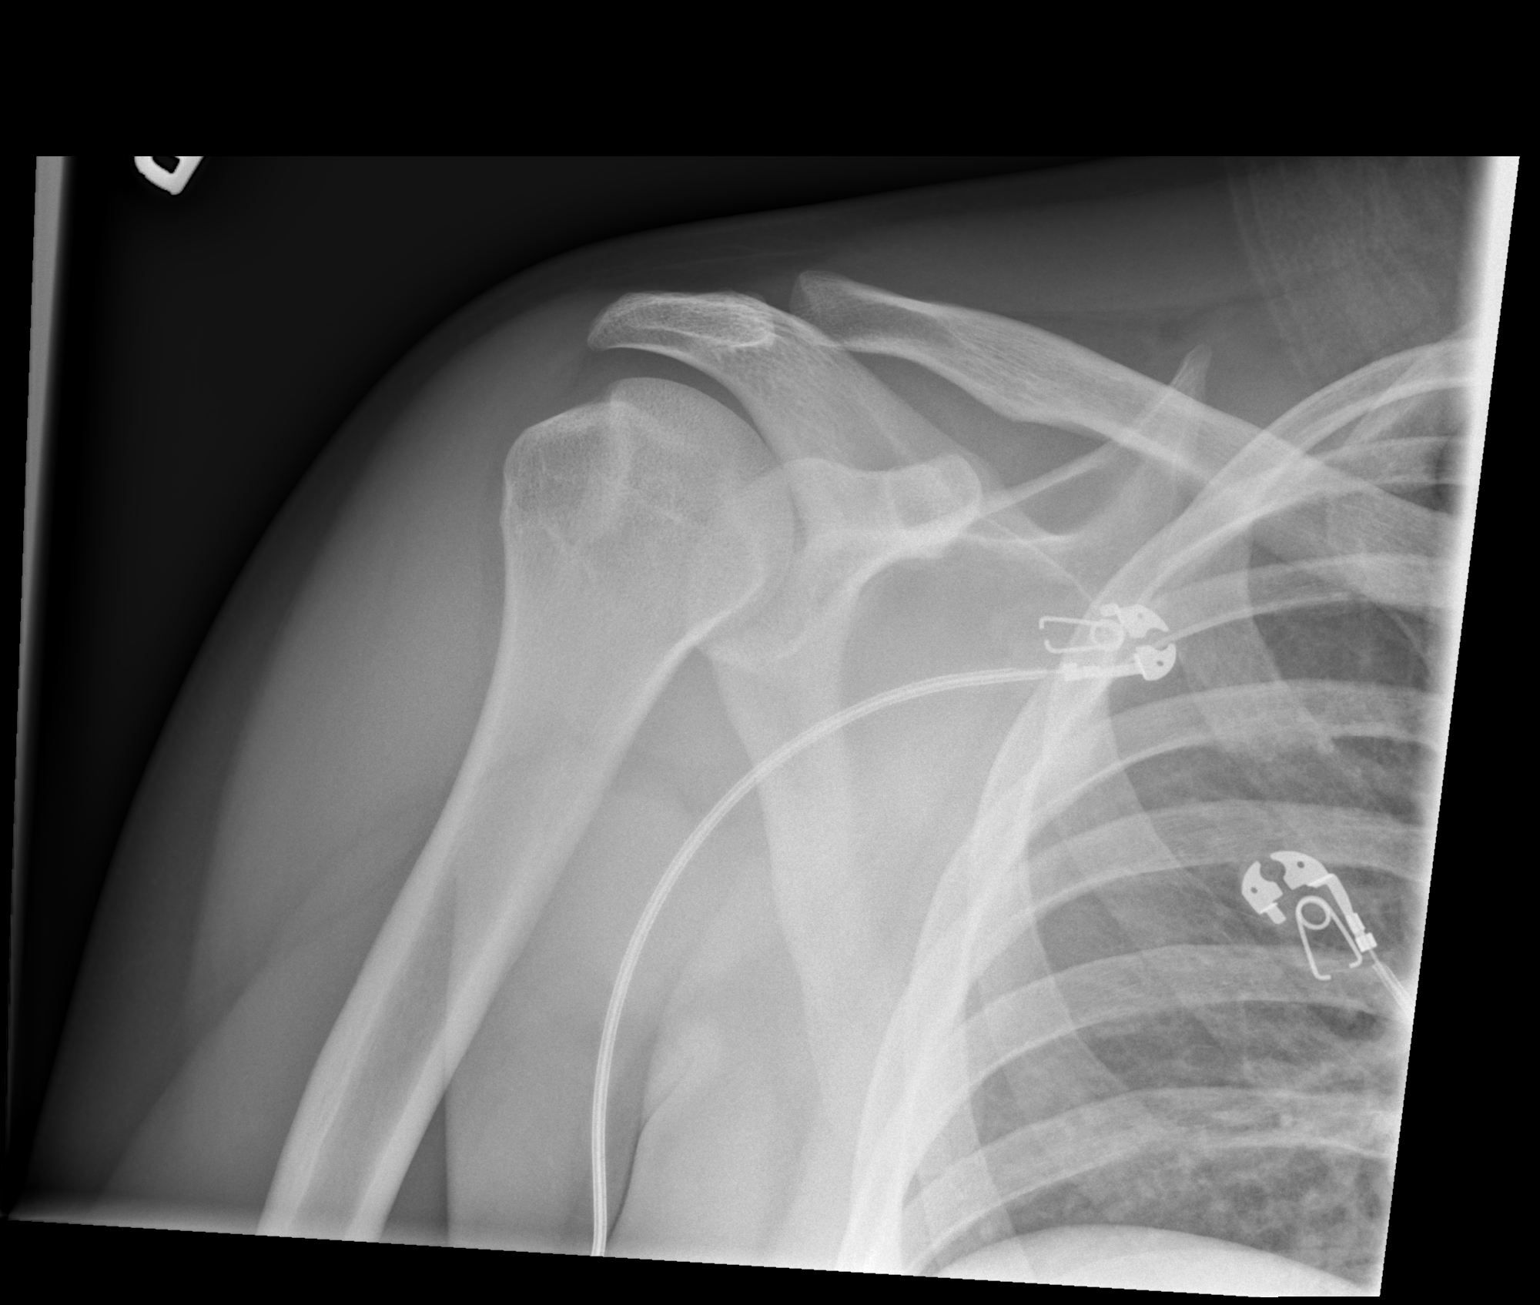

[x shoulder ap right (2 of 2)]
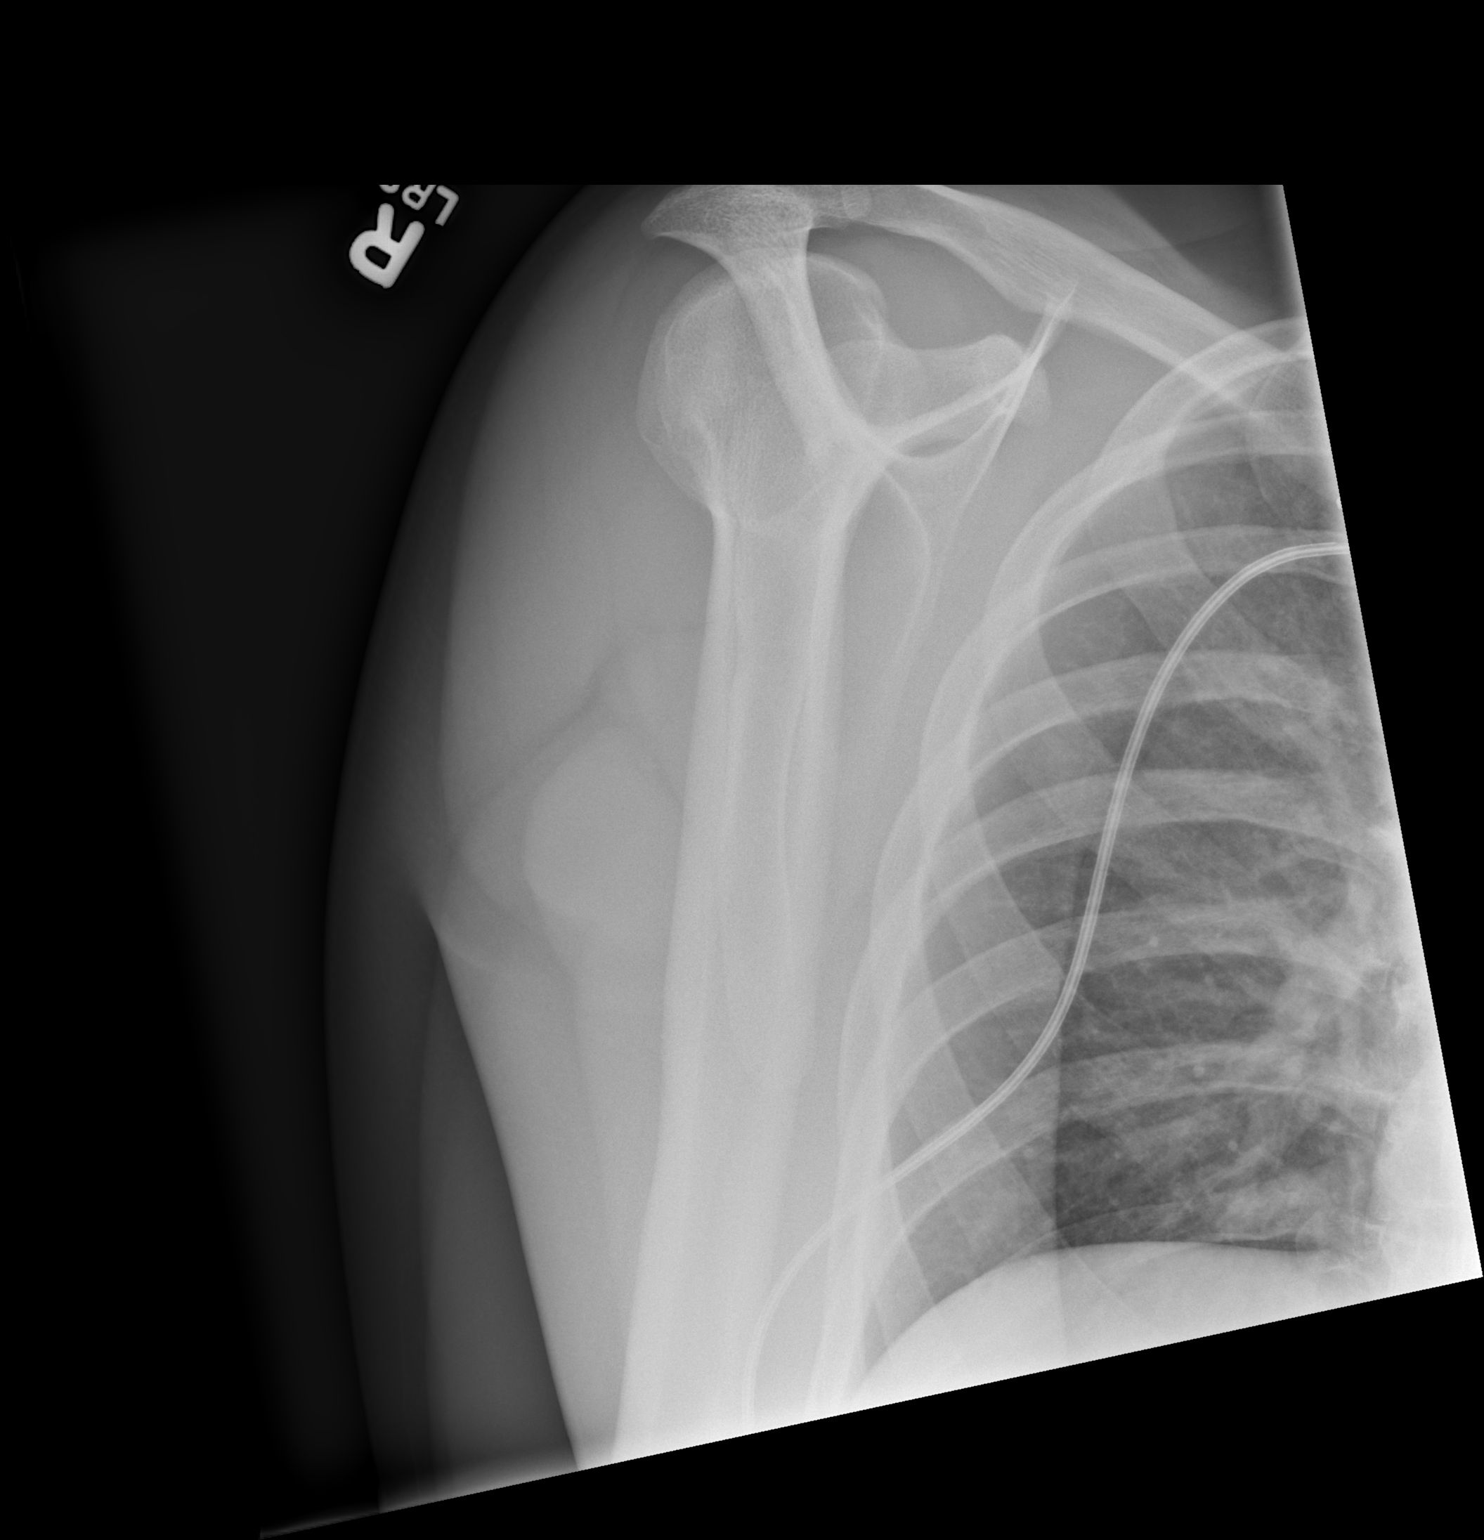

[2 of 2 positions shown; findings below may reference images not displayed]

FINDINGS: There is no evidence of fracture or dislocation. There is no
evidence of arthropathy or other focal bone abnormality. Soft
tissues are unremarkable.
IMPRESSION: Negative.
